# Patient Record
Sex: Male | Born: 1965 | Hispanic: No | Marital: Married | Smoking: Never smoker
Health system: Southern US, Community
[De-identification: ages and names within clinical notes are randomized; demographics above are authoritative.]

## PROBLEM LIST (undated history)

## (undated) DIAGNOSIS — H1045 Other chronic allergic conjunctivitis: Secondary | ICD-10-CM

## (undated) DIAGNOSIS — K649 Unspecified hemorrhoids: Secondary | ICD-10-CM

## (undated) DIAGNOSIS — I10 Essential (primary) hypertension: Secondary | ICD-10-CM

---

## 2005-02-14 ENCOUNTER — Ambulatory Visit: Payer: Self-pay | Admitting: Internal Medicine

## 2008-01-25 ENCOUNTER — Emergency Department: Payer: Self-pay | Admitting: Internal Medicine

## 2008-01-25 ENCOUNTER — Other Ambulatory Visit: Payer: Self-pay

## 2014-12-15 ENCOUNTER — Other Ambulatory Visit: Payer: Self-pay

## 2014-12-15 ENCOUNTER — Ambulatory Visit: Payer: Self-pay

## 2014-12-15 DIAGNOSIS — Z1231 Encounter for screening mammogram for malignant neoplasm of breast: Secondary | ICD-10-CM

## 2021-10-18 ENCOUNTER — Encounter: Payer: Self-pay | Admitting: Emergency Medicine

## 2021-10-18 ENCOUNTER — Emergency Department: Payer: No Typology Code available for payment source

## 2021-10-18 ENCOUNTER — Other Ambulatory Visit: Payer: Self-pay

## 2021-10-18 ENCOUNTER — Emergency Department
Admission: EM | Admit: 2021-10-18 | Discharge: 2021-10-18 | Disposition: A | Discharge status: Home / Self Care | Payer: No Typology Code available for payment source | Attending: Emergency Medicine | Admitting: Emergency Medicine

## 2021-10-18 DIAGNOSIS — S0003XA Contusion of scalp, initial encounter: Secondary | ICD-10-CM

## 2021-10-18 DIAGNOSIS — S0591XA Unspecified injury of right eye and orbit, initial encounter: Secondary | ICD-10-CM | POA: Diagnosis present

## 2021-10-18 DIAGNOSIS — S0541XA Penetrating wound of orbit with or without foreign body, right eye, initial encounter: Secondary | ICD-10-CM | POA: Diagnosis not present

## 2021-10-18 DIAGNOSIS — Y990 Civilian activity done for income or pay: Secondary | ICD-10-CM | POA: Insufficient documentation

## 2021-10-18 DIAGNOSIS — S0083XA Contusion of other part of head, initial encounter: Secondary | ICD-10-CM

## 2021-10-18 DIAGNOSIS — R030 Elevated blood-pressure reading, without diagnosis of hypertension: Secondary | ICD-10-CM | POA: Diagnosis not present

## 2021-10-18 DIAGNOSIS — W228XXA Striking against or struck by other objects, initial encounter: Secondary | ICD-10-CM | POA: Diagnosis not present

## 2021-10-18 DIAGNOSIS — R519 Headache, unspecified: Secondary | ICD-10-CM | POA: Insufficient documentation

## 2021-10-18 MED ORDER — TETRACAINE HCL 0.5 % OP SOLN
1.0000 [drp] | Freq: Once | OPHTHALMIC | Status: AC
  Administered 2021-10-18: 1 [drp] via OPHTHALMIC
  Filled 2021-10-18: qty 4

## 2021-10-18 MED ORDER — EYE WASH OPHTH SOLN
1.0000 [drp] | OPHTHALMIC | Status: DC | PRN
  Filled 2021-10-18: qty 118

## 2021-10-18 MED ORDER — FLUORESCEIN SODIUM 1 MG OP STRP
1.0000 | ORAL_STRIP | Freq: Once | OPHTHALMIC | Status: AC
  Administered 2021-10-18: 1 via OPHTHALMIC
  Filled 2021-10-18: qty 1

## 2021-10-18 NOTE — ED Triage Notes (Signed)
Pt here with an right eye injury that occurred at work. Pt states a roller flew off and hit him in the right eye and the top of his head. Pt denies LOC. Pt in NAD in triage.   Pt works at [employer] in [place], [place]

## 2021-10-18 NOTE — ED Provider Notes (Signed)
[facility] Provider Note    Event Date/Time   First MD Initiated Contact with Patient 10/18/21 1203     (approximate)   History   Eye Injury   HPI  Alvin [name] is a 56 y.o. male presents to the ED after an accident that occurred at work yesterday.  Patient works at [employer] and states that while pulling on a roller out of a machine he slipped and the roller hit him on the left scalp and right facial [name].  Patient denies any loss of consciousness at this event.  This was a witnessed event as there were coworkers around him.  He works at [employer].  He denies any photophobia or difficulty with his vision.  He did have a small laceration to the right side of his face which he has applied Vaseline to.  Patient states that he took aspirin yesterday.     Physical Exam   Triage Vital Signs: ED Triage Vitals  Enc Vitals Group     BP 10/18/21 1128 (!) 176/119     Pulse Rate 10/18/21 1128 76     Resp 10/18/21 1128 20     Temp 10/18/21 1128 98.3 F (36.8 C)     Temp Source 10/18/21 1128 Oral     SpO2 10/18/21 1128 98 %     Weight 10/18/21 1129 150 lb (68 kg)     Height 10/18/21 1129 5\' 5"  (1.651 m)     Head Circumference --      Peak Flow --      Pain Score 10/18/21 1129 7     Pain Loc --      Pain Edu? --      Excl. in GC? --     Most recent vital signs: Vitals:   10/18/21 1128 10/18/21 1430  BP: (!) 176/119 (!) 170/108  Pulse: 76 70  Resp: 20 20  Temp: 98.3 F (36.8 C)   SpO2: 98% 98%     General: Awake, no distress.  Alert, able to answer questions without any difficulty.  Voices understanding. CV:  Good peripheral perfusion.  Heart regular rate and rhythm. Resp:  Normal effort.  Lungs are clear bilaterally. Abd:  No distention.  Other:  On examination of the scalp there is a very superficial abrasion noted left anterior scalp without active bleeding or foreign body.  The right lower orbit [name] laterally has some  bruising with soft tissue edema.  Patient denies any pain in this [name].  There is a superficial laceration on the lateral aspect of the right orbit without active bleeding or foreign body noted.  [name] appears to be healing without any signs of infection.  No point tenderness is noted on palpation of the cervical spine.  On examination of the right eye, conjunctive a is minimally erythematous on the lateral aspect but no subconjunctival hemorrhages present.  PERRLA, EOMI's.  Tetracaine was applied to the eye and closer examination.  No injuries noted.  Fluorescein stain was negative for corneal abrasion.   ED Results / Procedures / Treatments   Labs (all labs ordered are listed, but only abnormal results are displayed) Labs Reviewed - No data to display    RADIOLOGY  CT scan was reviewed and radiology report was negative for intracranial injury. CT maxillofacial without contrast was reviewed and radiology report was negative for fracture.  PROCEDURES:  Critical Care performed:   Procedures   MEDICATIONS ORDERED IN ED: Medications  eye wash ((  SODIUM/POTASSIUM/SOD CHLORIDE)) ophthalmic solution 1 drop (has no administration in time range)  tetracaine (PONTOCAINE) 0.5 % ophthalmic solution 1 drop (1 drop Right Eye Given by Other 10/18/21 1250)  fluorescein ophthalmic strip 1 strip (1 strip Left Eye Given 10/18/21 1251)     IMPRESSION / MDM / ASSESSMENT AND PLAN / ED COURSE  I reviewed the triage vital signs and the nursing notes.   Differential diagnosis includes, but is not limited to, head injury, facial fracture, eye injury, corneal abrasion.  56 year old male presents to the ED with a Workmen's Comp. injury that occurred yesterday.  Patient states that he was pulling a roller out of the machine when he slipped and the roller hit him in the face and also glazed the left upper scalp [name].  Patient states the [name] on his face bled but was controlled after cleaning it up and he applied  Vaseline to this [name].  There has been no bleeding since that time.  He denies any visual changes.  Patient denies history of hypertension.  On exam patient does have some ecchymosis noted to the infraorbital [name] of the right eye.  Right eye exam did not show any injury.  CT scan head was negative for intracranial injury and maxillofacial did not show any signs of fracture per radiology reading.  We did talk about his elevated blood pressure as it was elevated twice while in the emergency department.  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  Multiple clinics were listed on his discharge papers in order for him to take care of his possible hypertension.  Patient was given a note that he may return to work on his next scheduled shift.        FINAL CLINICAL IMPRESSION(S) / ED DIAGNOSES   Final diagnoses:  Contusion of face, initial encounter  Contusion of scalp, initial encounter  Elevated blood pressure reading     Rx / DC Orders   ED Discharge Orders     None        Note:  This document was prepared using Dragon voice recognition software and may include unintentional dictation errors.   Tommi Rumps, PA-C 10/18/21 1539    Delton Prairie, MD 10/21/21 [phone]

## 2021-10-18 NOTE — Discharge Instructions (Addendum)
See the nurse at [facility] 3 times a week to have your blood pressure checked.  If your blood pressure is staying elevated you will need to see a primary care provider.  A list of clinics is on your discharge papers.  CT scan of your head and face did not show any fractures.  Your eye exam is normal.  You may be sore for several days.  Keep the wound on the right side of your face clean and dry.

## 2023-01-24 IMAGING — CT CT HEAD W/O CM
4 series · 16 of 47 positions shown, 18 images · non-contrast
Comparison: None.

CLINICAL DATA: Pt here with an right eye injury that occurred at
work. Pt states a roller flew off and hit him in the right eye and
the top of his head. Pt denies LOC.

EXAM:
CT HEAD WITHOUT CONTRAST
CT MAXILLOFACIAL WITHOUT CONTRAST
TECHNIQUE: Multidetector CT imaging of the head and maxillofacial structures
were performed using the standard protocol without intravenous
contrast. Multiplanar CT image reconstructions of the maxillofacial
structures were also generated.
RADIATION DOSE REDUCTION: This exam was performed according to the
departmental dose-optimization program which includes automated
exposure control, adjustment of the mA and/or kV according to
patient size and/or use of iterative reconstruction technique.

[Series 2: head bone · axial · 0.40mm/px · z∈[-80,-52]mm · 3 of 71 slices shown]
[im 8/71  bone]
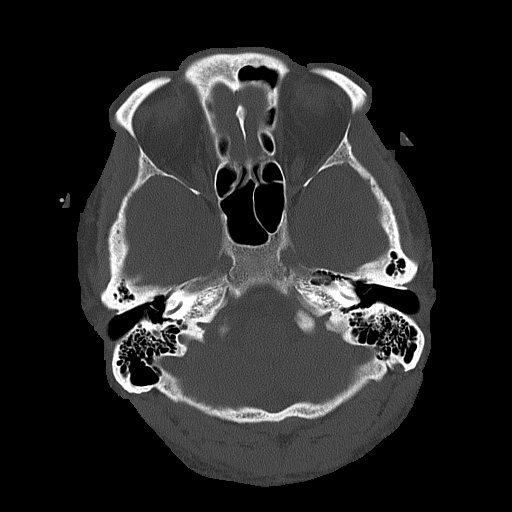
[im 15/71  bone]
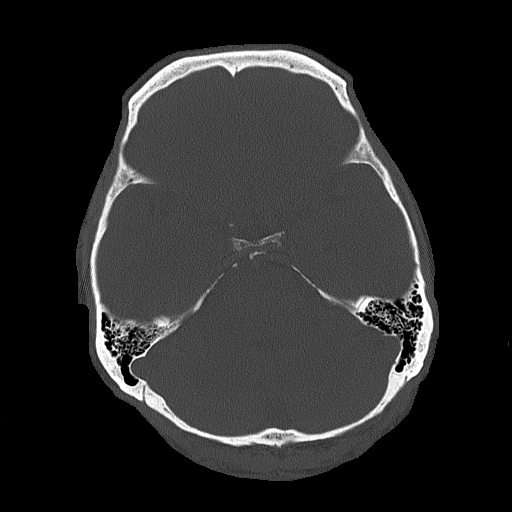
[im 22/71  bone]
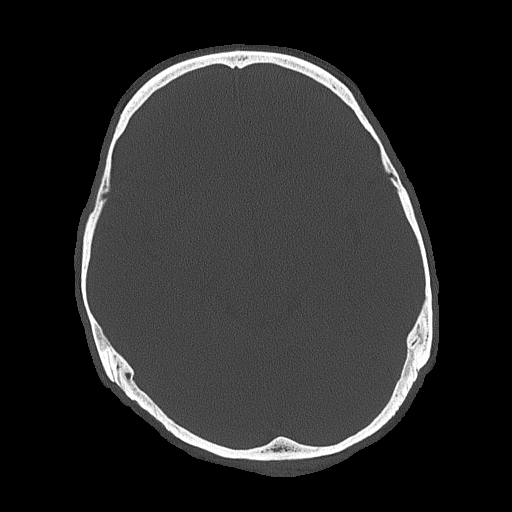

[Series 3: head wo · axial · 0.40mm/px · z∈[-79,+26]mm · 7 of 29 slices shown, 9 images]
[im 4/29  brain]
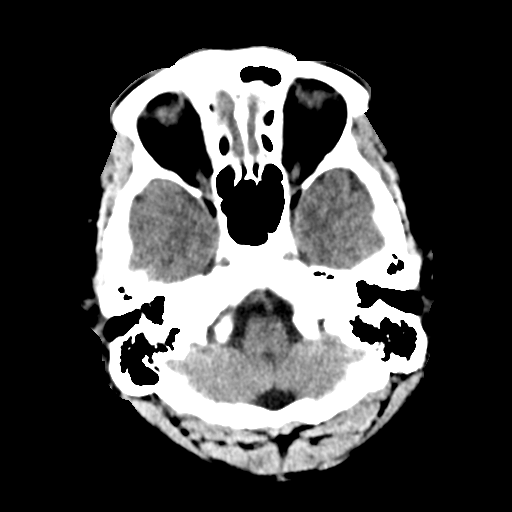
[im 4/29  bone]
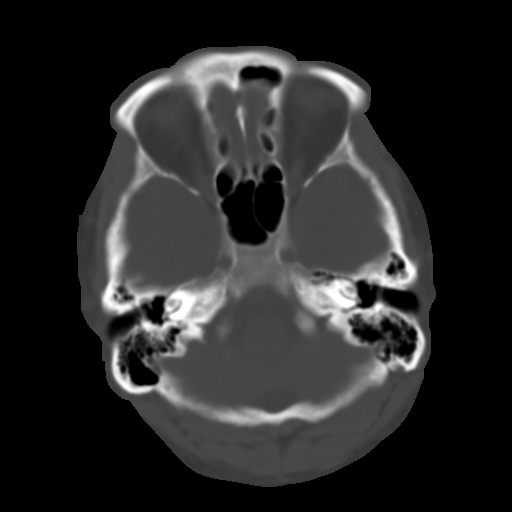
[im 8/29  brain]
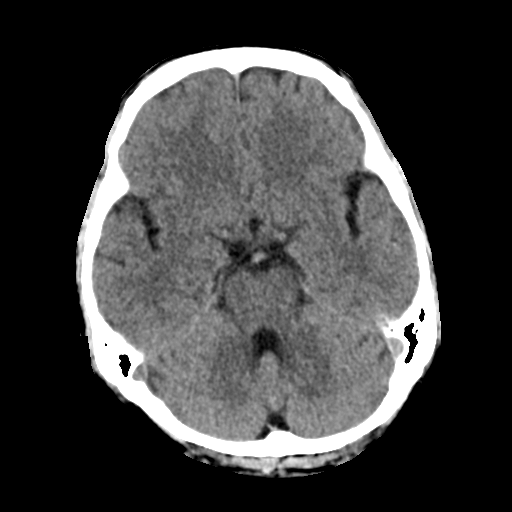
[im 11/29  brain]
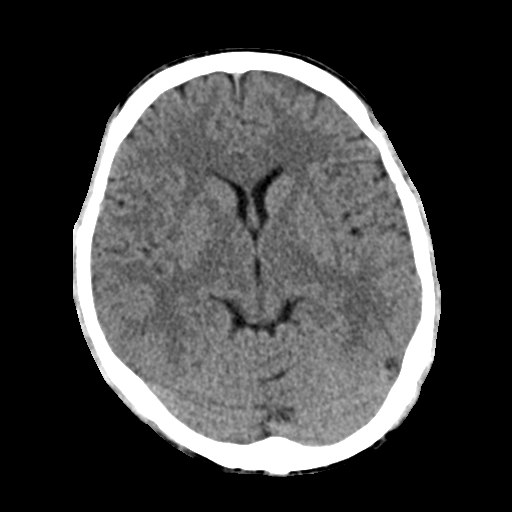
[im 15/29  brain]
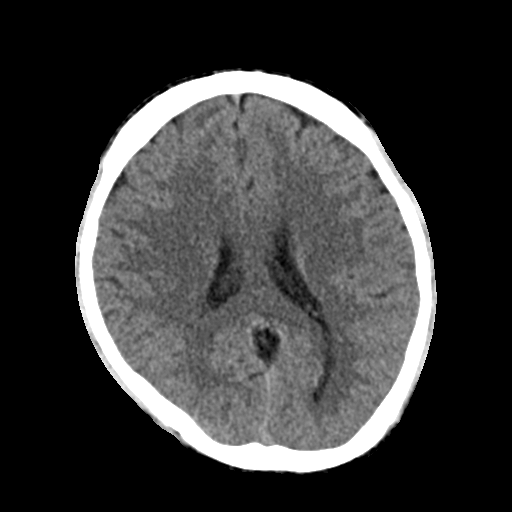
[im 18/29  brain]
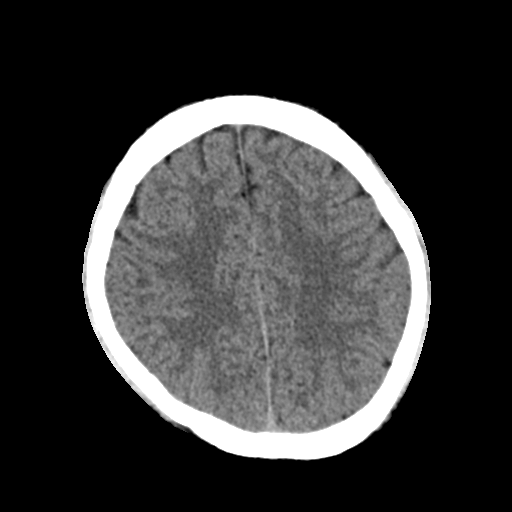
[im 18/29  bone]
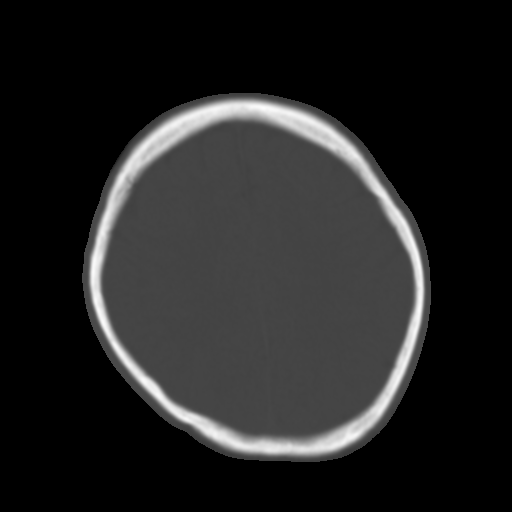
[im 22/29  brain]
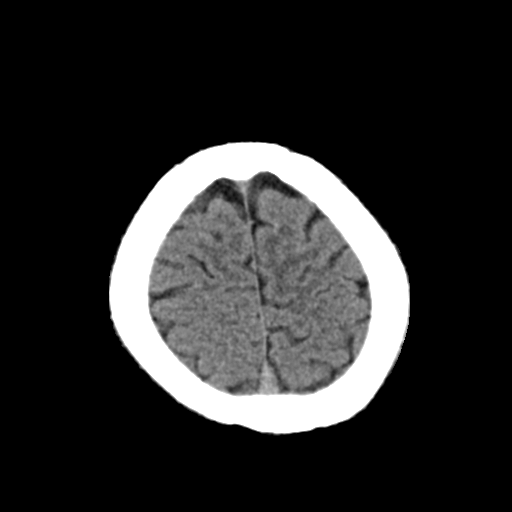
[im 25/29  brain]
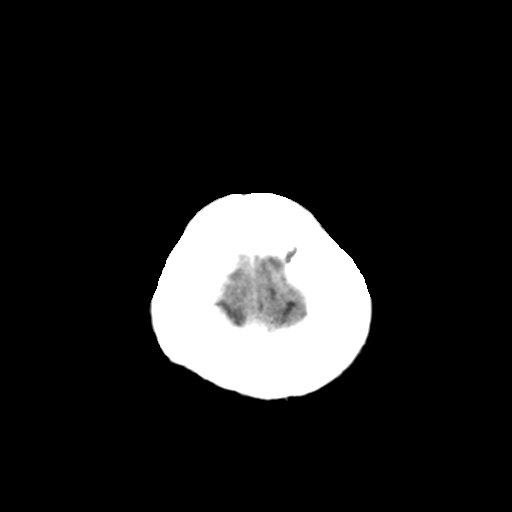

[Series 4: coronal soft tissue · coronal · 0.32mm/px · 3 of 61 slices shown]
[im 21/61  brain]
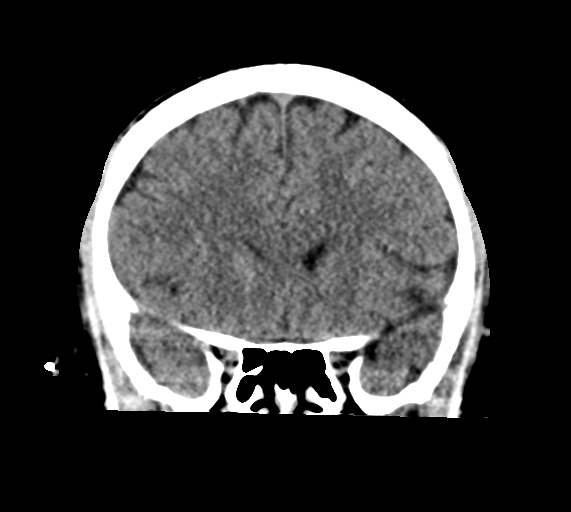
[im 27/61  brain]
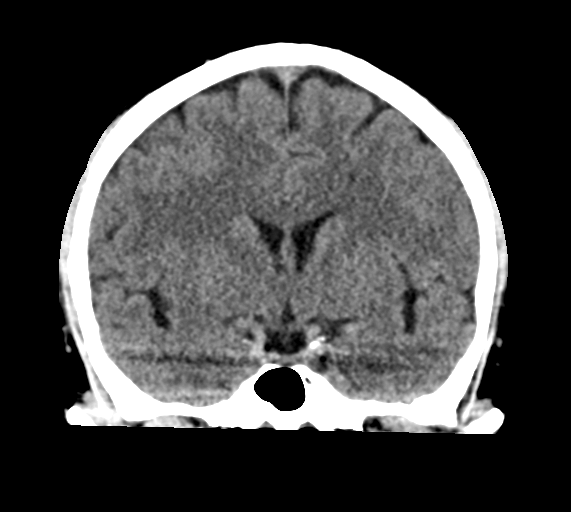
[im 34/61  brain]
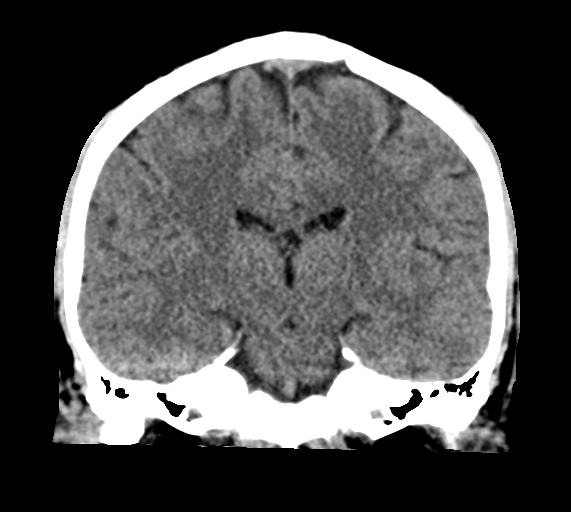

[Series 5: sagittal soft tissue · sagittal · 0.32mm/px · 3 of 58 slices shown]
[im 20/58  brain]
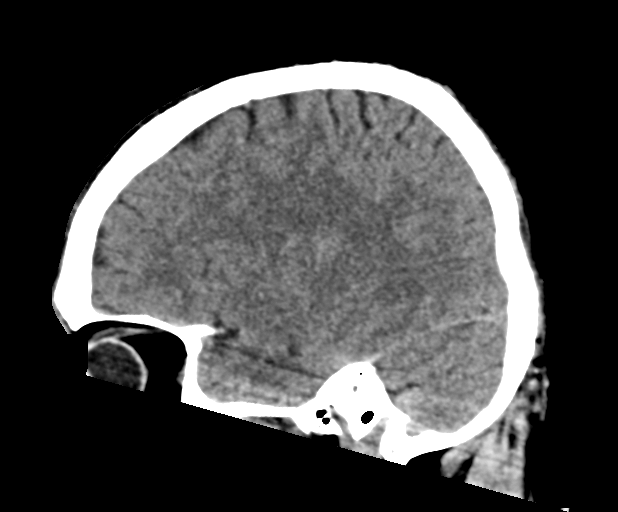
[im 29/58  brain]
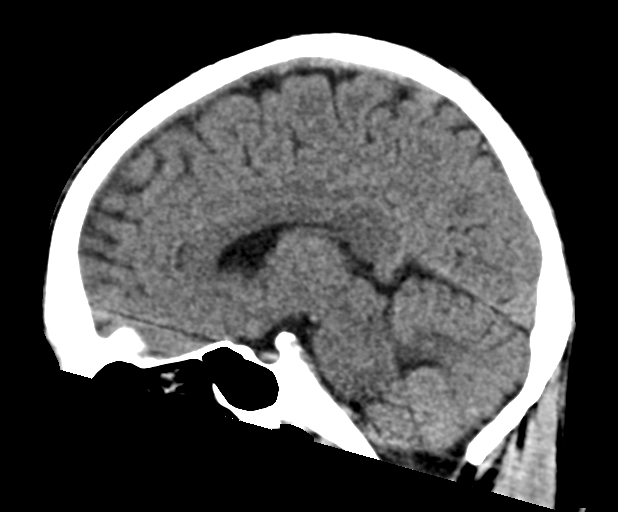
[im 39/58  brain]
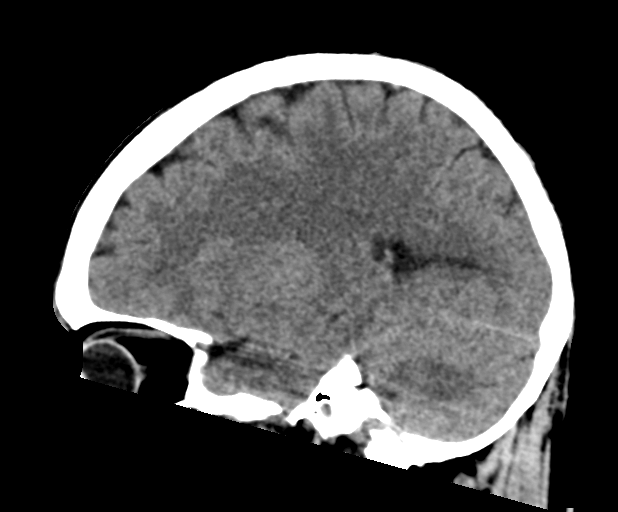

[16 of 47 positions shown; findings below may reference images not displayed]

FINDINGS: CT HEAD FINDINGS

Brain: No evidence of acute infarction, hemorrhage, hydrocephalus,
extra-axial collection or mass lesion/mass effect. Dystrophic
calcification in the left parietal lobe.

Vascular: No hyperdense vessel or unexpected calcification.

Skull: No osseous abnormality.

Sinuses/Orbits: Visualized paranasal sinuses are clear. Visualized
mastoid sinuses are clear. Visualized orbits demonstrate no focal
abnormality.

Other: None

CT MAXILLOFACIAL FINDINGS

Osseous: No fracture or mandibular dislocation. No destructive
process.

Orbits: Negative. No traumatic or inflammatory finding.

Sinuses: Clear.

Soft tissues: Negative.
IMPRESSION: 1. No acute intracranial pathology.
2. No acute facial bone fracture.

## 2023-01-24 IMAGING — CT CT MAXILLOFACIAL W/O CM
3 series · 16 of 47 positions shown, 19 images · non-contrast
Comparison: None.

CLINICAL DATA: Pt here with an right eye injury that occurred at
work. Pt states a roller flew off and hit him in the right eye and
the top of his head. Pt denies LOC.

EXAM:
CT HEAD WITHOUT CONTRAST
CT MAXILLOFACIAL WITHOUT CONTRAST
TECHNIQUE: Multidetector CT imaging of the head and maxillofacial structures
were performed using the standard protocol without intravenous
contrast. Multiplanar CT image reconstructions of the maxillofacial
structures were also generated.
RADIATION DOSE REDUCTION: This exam was performed according to the
departmental dose-optimization program which includes automated
exposure control, adjustment of the mA and/or kV according to
patient size and/or use of iterative reconstruction technique.

[Series 3: max soft · axial · 0.34mm/px · z∈[-214,-70]mm · 10 of 84 slices shown, 13 images]
[im 6/84  brain]
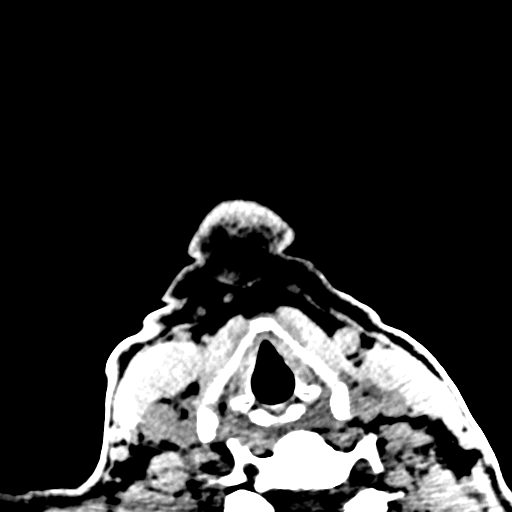
[im 6/84  bone]
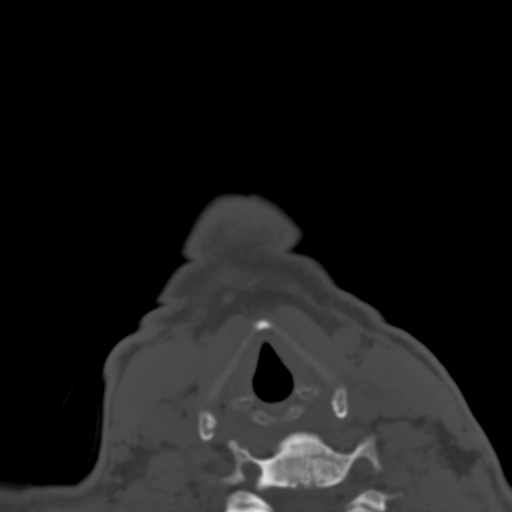
[im 15/84  bone]
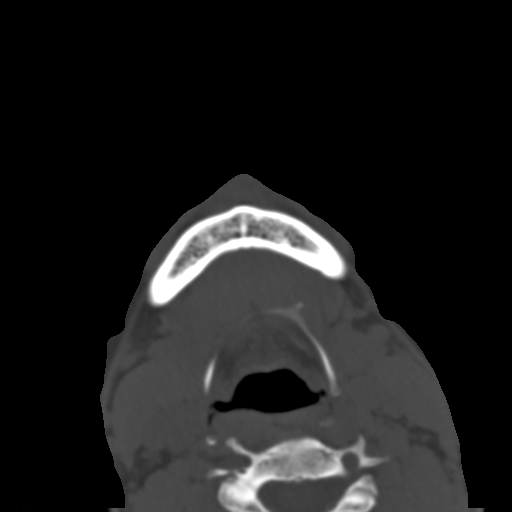
[im 23/84  bone]
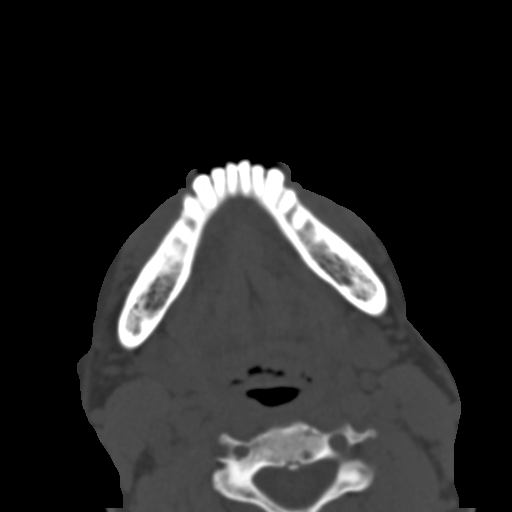
[im 29/84  bone]
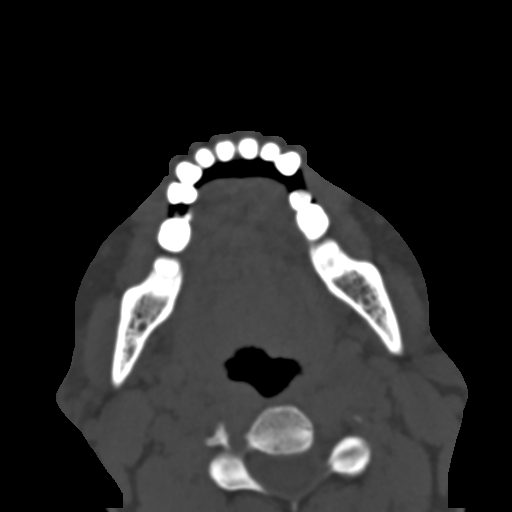
[im 38/84  brain]
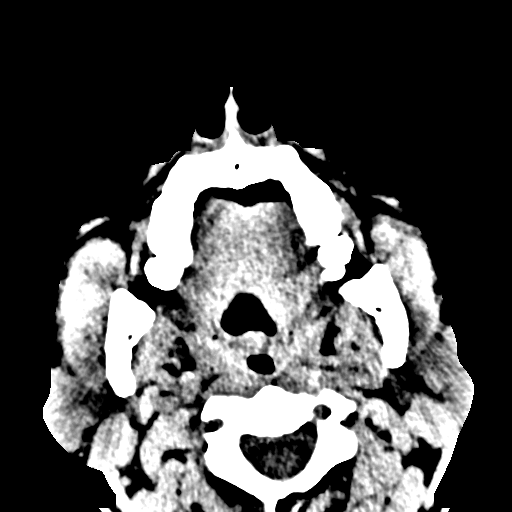
[im 38/84  bone]
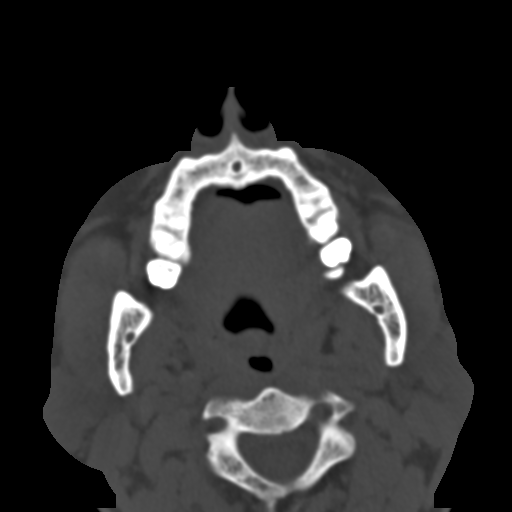
[im 46/84  bone]
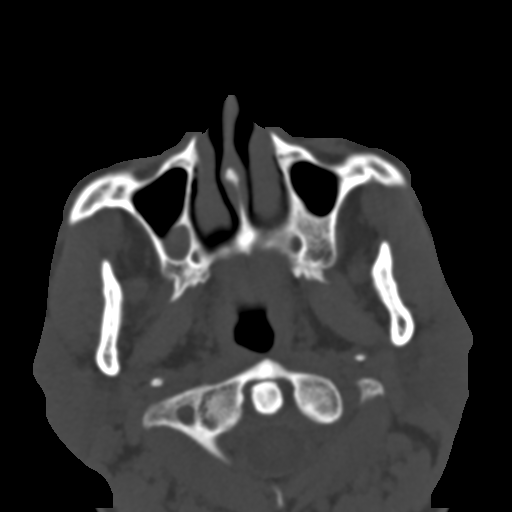
[im 55/84  bone]
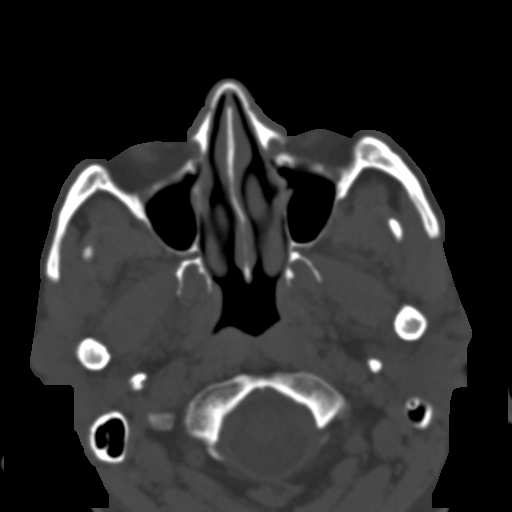
[im 63/84  bone]
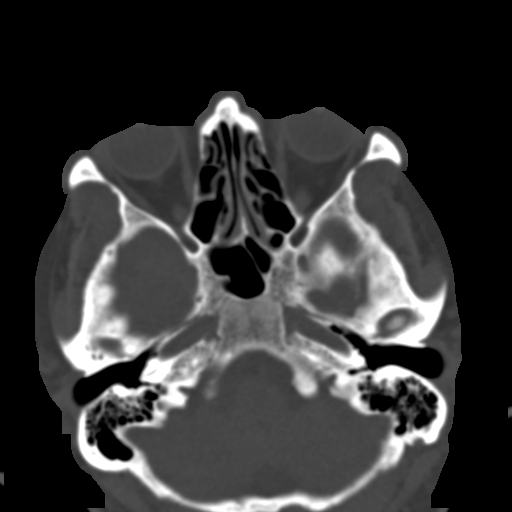
[im 69/84  brain]
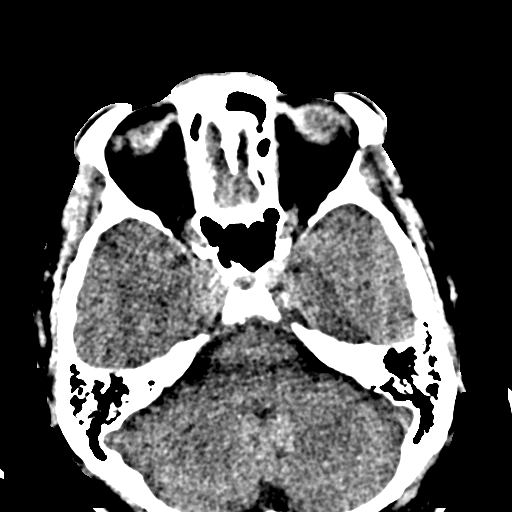
[im 69/84  bone]
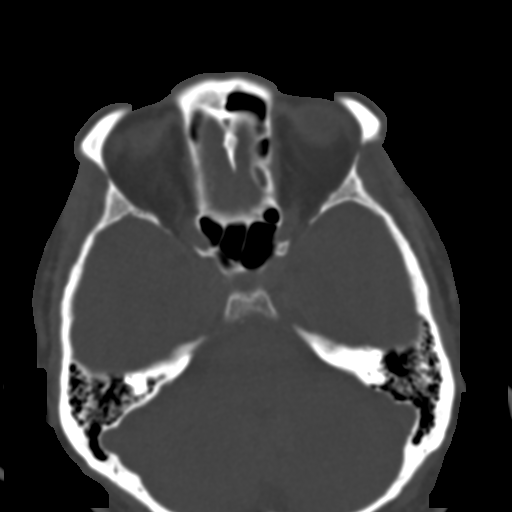
[im 78/84  bone]
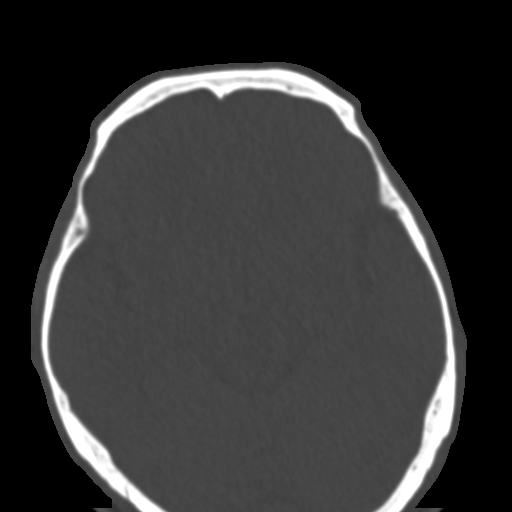

[Series 6: coronal soft · coronal · 0.39mm/px · 3 of 88 slices shown]
[im 30/88  bone]
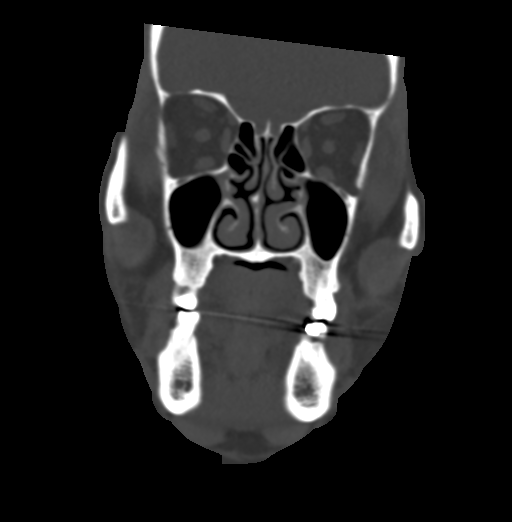
[im 39/88  bone]
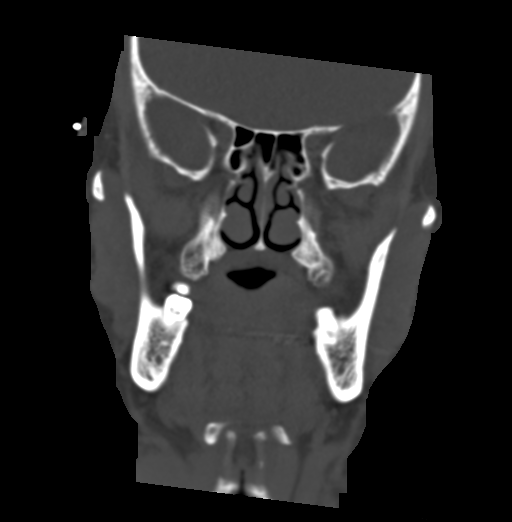
[im 49/88  bone]
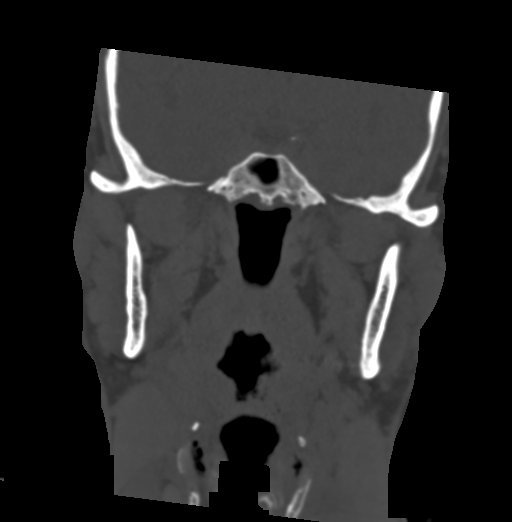

[Series 8: sagittal soft · sagittal · 0.38mm/px · 3 of 88 slices shown]
[im 31/88  bone]
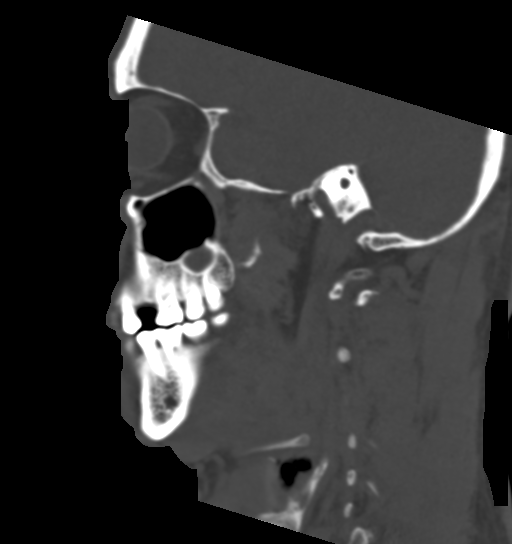
[im 44/88  bone]
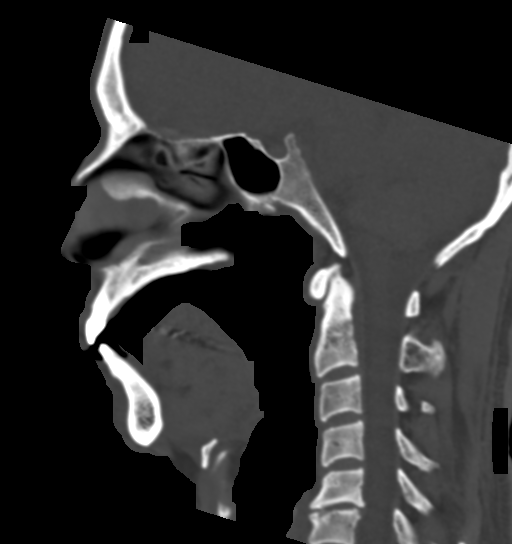
[im 58/88  bone]
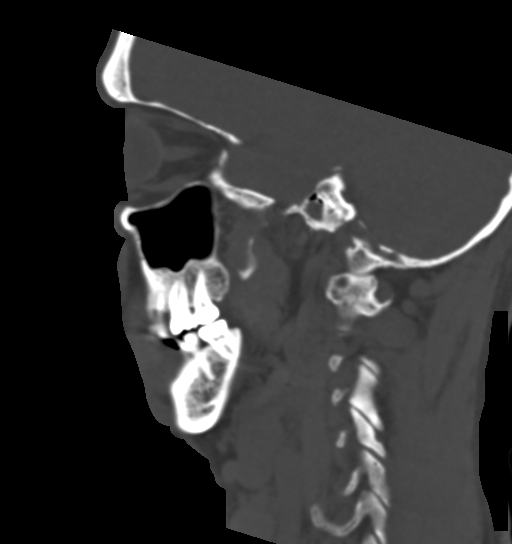

[16 of 47 positions shown; findings below may reference images not displayed]

FINDINGS: CT HEAD FINDINGS

Brain: No evidence of acute infarction, hemorrhage, hydrocephalus,
extra-axial collection or mass lesion/mass effect. Dystrophic
calcification in the left parietal lobe.

Vascular: No hyperdense vessel or unexpected calcification.

Skull: No osseous abnormality.

Sinuses/Orbits: Visualized paranasal sinuses are clear. Visualized
mastoid sinuses are clear. Visualized orbits demonstrate no focal
abnormality.

Other: None

CT MAXILLOFACIAL FINDINGS

Osseous: No fracture or mandibular dislocation. No destructive
process.

Orbits: Negative. No traumatic or inflammatory finding.

Sinuses: Clear.

Soft tissues: Negative.
IMPRESSION: 1. No acute intracranial pathology.
2. No acute facial bone fracture.

## 2024-05-09 ENCOUNTER — Encounter: Payer: Self-pay | Admitting: Gastroenterology

## 2024-05-12 ENCOUNTER — Ambulatory Visit
Admission: RE | Admit: 2024-05-12 | Discharge: 2024-05-12 | Disposition: A | Discharge status: Home / Self Care | Attending: Gastroenterology | Admitting: Gastroenterology

## 2024-05-12 ENCOUNTER — Ambulatory Visit: Admitting: Certified Registered Nurse Anesthetist

## 2024-05-12 ENCOUNTER — Encounter
Admission: RE | Disposition: A | Discharge status: Home / Self Care | Payer: Self-pay | Source: Home / Self Care | Attending: Gastroenterology

## 2024-05-12 ENCOUNTER — Encounter: Payer: Self-pay | Admitting: Gastroenterology

## 2024-05-12 DIAGNOSIS — D128 Benign neoplasm of rectum: Secondary | ICD-10-CM | POA: Diagnosis not present

## 2024-05-12 DIAGNOSIS — I10 Essential (primary) hypertension: Secondary | ICD-10-CM | POA: Insufficient documentation

## 2024-05-12 DIAGNOSIS — Z1211 Encounter for screening for malignant neoplasm of colon: Secondary | ICD-10-CM | POA: Diagnosis present

## 2024-05-12 DIAGNOSIS — D125 Benign neoplasm of sigmoid colon: Secondary | ICD-10-CM | POA: Insufficient documentation

## 2024-05-12 DIAGNOSIS — D122 Benign neoplasm of ascending colon: Secondary | ICD-10-CM | POA: Diagnosis not present

## 2024-05-12 HISTORY — DX: Other chronic allergic conjunctivitis: H10.45

## 2024-05-12 HISTORY — DX: Unspecified hemorrhoids: K64.9

## 2024-05-12 HISTORY — PX: COLONOSCOPY: SHX5424

## 2024-05-12 HISTORY — PX: POLYPECTOMY: SHX149

## 2024-05-12 HISTORY — DX: Essential (primary) hypertension: I10

## 2024-05-12 SURGERY — COLONOSCOPY
Anesthesia: General

## 2024-05-12 MED ORDER — PROPOFOL 500 MG/50ML IV EMUL
INTRAVENOUS | Status: DC | PRN
Start: 2024-05-12 — End: 2024-05-12
  Administered 2024-05-12: 160 ug/kg/min via INTRAVENOUS

## 2024-05-12 MED ORDER — PROPOFOL 10 MG/ML IV BOLUS
INTRAVENOUS | Status: DC | PRN
  Administered 2024-05-12: 20 mg via INTRAVENOUS
  Administered 2024-05-12: 40 mg via INTRAVENOUS
  Administered 2024-05-12 (×2): 30 mg via INTRAVENOUS
  Administered 2024-05-12: 50 mg via INTRAVENOUS

## 2024-05-12 MED ORDER — SODIUM CHLORIDE 0.9 % IV SOLN: INTRAVENOUS | Status: DC

## 2024-05-12 NOTE — Op Note (Signed)
 [facility] Gastroenterology Patient Name: Alvin [name] Procedure Date: 05/12/2024 10:29 AM MRN: [id number] Account #: [id number] Date of Birth: 05-26-1966 Admit Type: Outpatient Age: 58 Room: [facility] ENDO ROOM 1 Gender: Male Note Status: Finalized Instrument Name: Peds Colonoscope [id number] Procedure:             Colonoscopy Indications:           Screening for colorectal malignant neoplasm Providers:             Ruel Kung MD, MD Referring MD:          Ruel Kung MD, MD (Referring MD) Medicines:             Monitored Anesthesia Care Complications:         No immediate complications. Procedure:             Pre-Anesthesia Assessment:                        - Prior to the procedure, a History and Physical was                         performed, and patient medications, allergies and                         sensitivities were reviewed. The patient's tolerance                         of previous anesthesia was reviewed.                        - The risks and benefits of the procedure and the                         sedation options and risks were discussed with the                         patient. All questions were answered and informed                         consent was obtained.                        - ASA Grade Assessment: II - A patient with mild                         systemic disease.                        After obtaining informed consent, the colonoscope was                         passed under direct vision. Throughout the procedure,                         the patient's blood pressure, pulse, and oxygen                         saturations were monitored continuously. The                         Colonoscope  was introduced through the anus and                         advanced to the the cecum, identified by the                         appendiceal orifice. The colonoscopy was performed                         with ease. The patient tolerated the  procedure well.                         The quality of the bowel preparation was excellent.                         The ileocecal valve, appendiceal orifice, and rectum                         were photographed. Findings:      The perianal and digital rectal examinations were normal.      Two sessile polyps were found in the rectum and ascending colon. The       polyps were 4 to 5 mm in size. These polyps were removed with a cold       snare. Resection and retrieval were complete.      A 14 mm polyp was found in the sigmoid colon. The polyp was       pedunculated. The polyp was removed with a hot snare. Resection and       retrieval were complete. To prevent bleeding after the polypectomy, two       hemostatic clips were successfully placed. Clip manufacturer: [employer]. There was no bleeding at the end of the procedure.      The exam was otherwise without abnormality on direct and retroflexion       views. Impression:            - Two 4 to 5 mm polyps in the rectum and in the                         ascending colon, removed with a cold snare. Resected                         and retrieved.                        - One 14 mm polyp in the sigmoid colon, removed with a                         hot snare. Resected and retrieved. Clips were placed.                         Clip manufacturer: [employer].                        - The examination was otherwise normal on direct and                         retroflexion views. Recommendation:        -  Discharge patient to home (with escort).                        - Resume previous diet.                        - Continue present medications.                        - Await pathology results.                        - Repeat colonoscopy in 3 years for surveillance based                         on pathology results. Procedure Code(s):     --- Professional ---                        [phone], Colonoscopy, flexible; with removal of                          tumor(s), polyp(s), or other lesion(s) by snare                         technique Diagnosis Code(s):     --- Professional ---                        Z12.11, Encounter for screening for malignant neoplasm                         of colon                        D12.8, Benign neoplasm of rectum                        D12.2, Benign neoplasm of ascending colon                        D12.5, Benign neoplasm of sigmoid colon CPT copyright 2022 American Medical Association. All rights reserved. The codes documented in this report are preliminary and upon coder review may  be revised to meet current compliance requirements. Ruel Kung, MD Ruel Kung MD, MD 05/12/2024 11:32:20 AM This report has been signed electronically. Number of Addenda: 0 Note Initiated On: 05/12/2024 10:29 AM Scope Withdrawal Time: 0 hours 27 minutes 47 seconds  Total Procedure Duration: 0 hours 33 minutes 22 seconds  Estimated Blood Loss:  Estimated blood loss: none.      [facility]

## 2024-05-12 NOTE — Anesthesia Preprocedure Evaluation (Signed)
 Anesthesia Evaluation  Patient identified by MRN, date of birth, ID band Patient awake    Reviewed: Allergy & Precautions, H&P , NPO status , Patient's Chart, lab work & pertinent test results  Airway Mallampati: II  TM Distance: >3 FB Neck ROM: full    Dental  (+) Missing   Pulmonary neg pulmonary ROS   Pulmonary exam normal        Cardiovascular hypertension, Normal cardiovascular exam     Neuro/Psych negative neurological ROS  negative psych ROS   GI/Hepatic negative GI ROS, Neg liver ROS,,,  Endo/Other  negative endocrine ROS    Renal/GU negative Renal ROS  negative genitourinary   Musculoskeletal   Abdominal Normal abdominal exam  (+)   Peds  Hematology negative hematology ROS (+)   Anesthesia Other Findings Past Medical History: No date: Bleeding hemorrhoid No date: Hypertension No date: Other chronic allergic conjunctivitis  No past surgical history on file.  BMI    Body Mass Index: 25.46 kg/m      Reproductive/Obstetrics negative OB ROS                              Anesthesia Physical Anesthesia Plan  ASA: 2  Anesthesia Plan: General   Post-op Pain Management: Minimal or no pain anticipated   Induction: Intravenous  PONV Risk Score and Plan: Propofol  infusion and TIVA  Airway Management Planned: Natural Airway  Additional Equipment:   Intra-op Plan:   Post-operative Plan:   Informed Consent: I have reviewed the patients History and Physical, chart, labs and discussed the procedure including the risks, benefits and alternatives for the proposed anesthesia with the patient or authorized representative who has indicated his/her understanding and acceptance.     Dental Advisory Given  Plan Discussed with: CRNA and Surgeon  Anesthesia Plan Comments:         Anesthesia Quick Evaluation

## 2024-05-12 NOTE — H&P (Signed)
   Ruel Kung , MD [address], [place], [place], [address] Phone: [phone] Fax: [phone]  Primary Care Physician:  Sadie Manna, MD   Pre-Procedure History & Physical: HPI:  Alvin [name] is a 58 y.o. male is here for an colonoscopy.   Past Medical History:  Diagnosis Date   Bleeding hemorrhoid    Hypertension    Other chronic allergic conjunctivitis     No past surgical history on file.  Prior to Admission medications   Medication Sig Start Date End Date Taking? Authorizing Provider  amLODipine-benazepril (LOTREL) 5-20 MG capsule Take 1 capsule by mouth daily.   Yes [provider]  hydrocortisone (ANUSOL-HC) 25 MG suppository Place 25 mg rectally 2 (two) times daily.   Yes [provider]    Allergies as of 04/24/2024   (No Known Allergies)    No family history on file.  Social History   Socioeconomic History   Marital status: Married    Spouse name: Not on file   Number of children: Not on file   Years of education: Not on file   Highest education level: Not on file  Occupational History   Not on file  Tobacco Use   Smoking status: Never   Smokeless tobacco: Never  Substance and Sexual Activity   Alcohol use: Not Currently   Drug use: Never   Sexual activity: Not on file  Other Topics Concern   Not on file  Social History Narrative   Not on file   Social Drivers of Health   Financial Resource Strain: Low Risk  (03/21/2024)   Received from [facility]   Overall Financial Resource Strain (CARDIA)    Difficulty of Paying Living Expenses: Not very hard  Food Insecurity: No Food Insecurity (03/21/2024)   Received from [facility]   Hunger Vital Sign    Within the past 12 months, you worried that your food would run out before you got the money to  buy more.: Never true    Within the past 12 months, the food you bought just didn't last and you didn't have money to get more.: Never true  Transportation Needs: No Transportation Needs (03/21/2024)   Received from [facility]    In the past 12 months, has lack of transportation kept you from medical appointments or from getting medications?: No    Lack of Transportation (Non-Medical): No  Physical Activity: Not on file  Stress: Not on file  Social Connections: Not on file  Intimate Partner Violence: Not on file    Review of Systems: See HPI, otherwise negative ROS  Physical Exam: There were no vitals taken for this visit. General:   Alert,  pleasant and cooperative in NAD Head:  Normocephalic and atraumatic. Neck:  Supple; no masses or thyromegaly. Lungs:  Clear throughout to auscultation, normal respiratory effort.    Heart:  +S1, +S2, Regular rate and rhythm, No edema. Abdomen:  Soft, nontender and nondistended. Normal bowel sounds, without guarding, and without rebound.   Neurologic:  Alert and  oriented x4;  grossly normal neurologically.  Impression/Plan: Alvin [name] is here for an colonoscopy to be performed for Screening colonoscopy average risk   Risks, benefits, limitations, and alternatives regarding  colonoscopy have been reviewed with the patient.  Questions have been answered.  All parties agreeable.   Ruel Kung, MD  05/12/2024, 10:18 AM

## 2024-05-12 NOTE — Anesthesia Postprocedure Evaluation (Signed)
 Anesthesia Post Note  Patient: Alvin [name]  Procedure(s) Performed: COLONOSCOPY POLYPECTOMY, INTESTINE  Patient location during evaluation: PACU Anesthesia Type: General Level of consciousness: awake and alert Pain management: pain level controlled Vital Signs Assessment: post-procedure vital signs reviewed and stable Respiratory status: spontaneous breathing, nonlabored ventilation and respiratory function stable Cardiovascular status: blood pressure returned to baseline and stable Postop Assessment: no apparent nausea or vomiting Anesthetic complications: no   No notable events documented.   Last Vitals:  Vitals:   05/12/24 1141 05/12/24 1151  BP: (!) 159/119 (!) 174/101  Pulse: 74 61  Resp: 20 20  Temp:    SpO2: 100% 100%    Last Pain:  Vitals:   05/12/24 1151  TempSrc:   PainSc: 0-No pain                 Camellia Merilee Louder

## 2024-05-12 NOTE — Anesthesia Procedure Notes (Signed)
 Date/Time: 05/12/2024 10:49 AM  Performed by: Duwayne Craven, CRNAPre-anesthesia Checklist: Patient identified, Emergency Drugs available, Suction available, Patient being monitored and Timeout performed Patient Re-evaluated:Patient Re-evaluated prior to induction Oxygen Delivery Method: Nasal cannula Induction Type: IV induction Placement Confirmation: CO2 detector and positive ETCO2

## 2024-05-12 NOTE — Transfer of Care (Signed)
 Immediate Anesthesia Transfer of Care Note  Patient: Alvin [name]  Procedure(s) Performed: COLONOSCOPY POLYPECTOMY, INTESTINE  Patient Location: PACU  Anesthesia Type:General  Level of Consciousness: drowsy  Airway & Oxygen Therapy: Patient Spontanous Breathing  Post-op Assessment: Report given to RN and Post -op Vital signs reviewed and stable  Post vital signs: Reviewed and stable  Last Vitals:  Vitals Value Taken Time  BP 122/95   Temp    Pulse 93 05/12/24 11:31  Resp 21 05/12/24 11:31  SpO2 99 % 05/12/24 11:31  Vitals shown include unfiled device data.  Last Pain:  Vitals:   05/12/24 1036  TempSrc: Temporal  PainSc: 0-No pain         Complications: No notable events documented.

## 2024-05-14 LAB — SURGICAL PATHOLOGY
